# Patient Record
Sex: Female | Born: 2000 | Race: Black or African American | Hispanic: No | Marital: Single | Smoking: Never smoker
Health system: Southern US, Community
[De-identification: ages and names within clinical notes are randomized; demographics above are authoritative.]

---
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
## 2022-08-20 ENCOUNTER — Ambulatory Visit (HOSPITAL_COMMUNITY)
Admission: EM | Admit: 2022-08-20 | Discharge: 2022-08-20 | Disposition: A | Discharge status: Home / Self Care | Payer: Self-pay | Attending: Internal Medicine | Admitting: Internal Medicine

## 2022-08-20 DIAGNOSIS — R1030 Lower abdominal pain, unspecified: Secondary | ICD-10-CM | POA: Insufficient documentation

## 2022-08-20 DIAGNOSIS — R519 Headache, unspecified: Secondary | ICD-10-CM | POA: Insufficient documentation

## 2022-08-20 DIAGNOSIS — N3001 Acute cystitis with hematuria: Secondary | ICD-10-CM | POA: Insufficient documentation

## 2022-08-20 LAB — POCT URINALYSIS DIPSTICK, ED / UC
Bilirubin Urine: NEGATIVE
Glucose, UA: NEGATIVE mg/dL
Ketones, ur: NEGATIVE mg/dL
Nitrite: NEGATIVE
Protein, ur: 100 mg/dL — AB
Specific Gravity, Urine: 1.02 (ref 1.005–1.030)
Urobilinogen, UA: 1 mg/dL (ref 0.0–1.0)
pH: 5.5 (ref 5.0–8.0)

## 2022-08-20 LAB — POC URINE PREG, ED: Preg Test, Ur: NEGATIVE

## 2022-08-20 MED ORDER — CEPHALEXIN 500 MG PO CAPS: 500.0000 mg | ORAL_CAPSULE | Freq: Two times a day (BID) | ORAL | 0 refills | Status: AC

## 2022-08-20 MED ORDER — ACETAMINOPHEN 500 MG PO TABS: 1000.0000 mg | ORAL_TABLET | Freq: Four times a day (QID) | ORAL | 0 refills | Status: DC | PRN

## 2022-08-20 MED ORDER — IBUPROFEN 600 MG PO TABS: 600.0000 mg | ORAL_TABLET | Freq: Four times a day (QID) | ORAL | 0 refills | Status: AC | PRN

## 2022-08-20 NOTE — Discharge Instructions (Addendum)
You have a urinary tract infection.  Take Keflex twice daily for the next 7 days to treat this.  Take this medicine with food to avoid stomach upset.  Take ibuprofen every 6 hours as needed for abdominal cramping associated with menstrual cycle and headache.  Increase your water intake to at least 8 cups of water per day to stay well-hydrated.  We gave you a dose of ibuprofen in the clinic.  Your next dose may be at 2 AM tomorrow morning if needed.   Take Tylenol 1000 mg every 6 hours as needed for menstrual cramping and headache as well.   If you develop any new or worsening symptoms or do not improve in the next 2 to 3 days, please return.  If your symptoms are severe, please go to the emergency room.  Follow-up with your primary care provider for further evaluation and management of your symptoms as well as ongoing wellness visits.  I hope you feel better!

## 2022-08-20 NOTE — ED Provider Notes (Signed)
[facility]    CSN: [id number] Arrival date & time: 08/20/22  1549      History   Chief Complaint Chief Complaint  Patient presents with   Abdominal Cramping   Headache    HPI Charlene Cooper is a 21 y.o. female.   Patient presents presents to urgent care for evaluation of headache that started 3 days ago and abdominal cramping that started yesterday. Headache is to the back of the head and currently an 8.5 on a scale of 0-10.  Denies blurry vision and decreased visual acuity.  She has attempted use of 600 mg of Advil over-the-counter yesterday without relief of head pain.  She has had similar headaches in the past that have responded well to ibuprofen and Tylenol.  Denies upper respiratory tract infection symptoms, fever/chills, back pain, eye discharge, eye redness, ear pain, and sore throat.  She also reports abdominal cramping that is localized to the lower abdomen and currently rates this at a 9 on a scale of 0-10.  She states that this is consistent with abdominal cramping prior to her menstrual cycle.  Last menstrual cycle was at the end of July/the beginning of August and she states that she is scheduled to have a cycle soon.  Denies vaginal bleeding, discharge, odor, and itch.  Denies urinary frequency, urgency, and hesitancy but reports dysuria for the last week or so.  Reports nausea but denies vomiting.   Abdominal Cramping Associated symptoms include headaches.  Headache   No past medical history on file.  Patient Active Problem List   Diagnosis Date Noted   Pain in joint, pelvic region and thigh 10/24/2016      OB History   No obstetric history on file.      Home Medications    Prior to Admission medications   Medication Sig Start Date End Date Taking? Authorizing Provider  acetaminophen (TYLENOL) 500 MG tablet Take 2 tablets (1,000 mg total) by mouth every 6 (six) hours as needed. 08/20/22  Yes Carlisle Beers, FNP  cephALEXin (KEFLEX)  500 MG capsule Take 1 capsule (500 mg total) by mouth 2 (two) times daily for 7 days. 08/20/22 08/27/22 Yes Kavitha Lansdale, Donavan Burnet, FNP  ibuprofen (ADVIL) 600 MG tablet Take 1 tablet (600 mg total) by mouth every 6 (six) hours as needed. 08/20/22  Yes Carlisle Beers, FNP  pyridOXINE (VITAMIN B-6) 100 MG tablet Take 1 tablet (100 mg total) by mouth daily. 10/24/16   John Giovanni, MD    Family History No family history on file.  Social History     Allergies   Patient has no known allergies.   Review of Systems Review of Systems  Neurological:  Positive for headaches.  Per HPI   Physical Exam Triage Vital Signs ED Triage Vitals  Enc Vitals Group     BP 08/20/22 1635 111/63     Pulse Rate 08/20/22 1635 74     Resp 08/20/22 1635 17     Temp 08/20/22 1635 98.4 F (36.9 C)     Temp Source 08/20/22 1635 Oral     SpO2 08/20/22 1635 98 %     Weight --      Height --      Head Circumference --      Peak Flow --      Pain Score 08/20/22 1633 9     Pain Loc --      Pain Edu? --      Excl. in GC? --  No data found.  Updated Vital Signs BP 111/63 (BP Location: Left Arm)   Pulse 74   Temp 98.4 F (36.9 C) (Oral)   Resp 17   LMP 08/19/2022 (Exact Date)   SpO2 98%   Visual Acuity Right Eye Distance:   Left Eye Distance:   Bilateral Distance:    Right Eye Near:   Left Eye Near:    Bilateral Near:     Physical Exam Vitals and nursing note reviewed.  Constitutional:      Appearance: Normal appearance. She is not ill-appearing or toxic-appearing.     Comments: Very pleasant patient sitting on exam in position of comfort table in no acute distress.   HENT:     Head: Normocephalic and atraumatic.     Right Ear: Hearing and external ear normal.     Left Ear: Hearing and external ear normal.     Nose: Nose normal.     Mouth/Throat:     Lips: Pink.     Mouth: Mucous membranes are moist.  Eyes:     General: Lids are normal. Vision grossly intact. Gaze aligned  appropriately.     Extraocular Movements: Extraocular movements intact.     Conjunctiva/sclera: Conjunctivae normal.  Cardiovascular:     Rate and Rhythm: Normal rate and regular rhythm.     Heart sounds: Normal heart sounds, S1 normal and S2 normal.  Pulmonary:     Effort: Pulmonary effort is normal. No respiratory distress.     Breath sounds: Normal breath sounds and air entry.  Abdominal:     General: Bowel sounds are normal.     Palpations: Abdomen is soft.     Tenderness: There is abdominal tenderness in the suprapubic area. There is no right CVA tenderness, left CVA tenderness or guarding.     Comments: No peritoneal signs.  Musculoskeletal:     Cervical back: Neck supple.  Skin:    General: Skin is warm and dry.     Capillary Refill: Capillary refill takes less than 2 seconds.     Findings: No rash.  Neurological:     General: No focal deficit present.     Mental Status: She is alert and oriented to person, place, and time. Mental status is at baseline.     Cranial Nerves: No dysarthria or facial asymmetry.     Gait: Gait is intact.  Psychiatric:        Mood and Affect: Mood normal.        Speech: Speech normal.        Behavior: Behavior normal.        Thought Content: Thought content normal.        Judgment: Judgment normal.      UC Treatments / Results  Labs (all labs ordered are listed, but only abnormal results are displayed) Labs Reviewed  POCT URINALYSIS DIPSTICK, ED / UC - Abnormal; Notable for the following components:      Result Value   Hgb urine dipstick LARGE (*)    Protein, ur 100 (*)    Leukocytes,Ua TRACE (*)    All other components within normal limits  URINE CULTURE  POC URINE PREG, ED    EKG   Radiology No results found.  Procedures Procedures (including critical care time)  Medications Ordered in UC Medications - No data to display  Initial Impression / Assessment and Plan / UC Course  I have reviewed the triage vital signs and  the nursing notes.  Pertinent labs &  imaging results that were available during my care of the patient were reviewed by me and considered in my medical decision making (see chart for details).   1. Acute cystitis with hematuria Urinalysis shows large hemoglobin with trace leukocytes.  Given patient's symptoms, this is consistent with uncomplicated urinary tract infection and we will begin Keflex twice daily for the next 7 days to treat this.  Urine culture is pending.  Advised patient to take medicine with food to avoid stomach upset.  Advised patient to increase water intake to at least 8 cups of water per day to stay well-hydrated and avoid urinary irritants.  2.  Lower abdominal pain and bad headache Lower abdominal pain is likely related to acute cystitis and may also be related to patient's oncoming menstrual cycle.  She may use 600 mg of ibuprofen every 6 hours as needed with food to reduce inflammation causing abdominal pain/cramping.  She may also apply heat to the abdomen as needed for further pain relief.  Tylenol 1000 mg every 6 hours may be used as needed for menstrual cramping and headache as well.  Nonfocal neuro exam.  Advised her to return to urgent care if headache does not improve or resolve in the next 24 to 48 hours with uses of medications prescribed at today's visit.  Discussed physical exam and available lab work findings in clinic with patient.  Counseled patient regarding appropriate use of medications and potential side effects for all medications recommended or prescribed today. Discussed red flag signs and symptoms of worsening condition,when to call the PCP office, return to urgent care, and when to seek higher level of care in the emergency department. Patient verbalizes understanding and agreement with plan. All questions answered. Patient discharged in stable condition.  Final Clinical Impressions(s) / UC Diagnoses   Final diagnoses:  Acute cystitis with hematuria  Bad  headache  Lower abdominal pain     Discharge Instructions      You have a urinary tract infection.  Take Keflex twice daily for the next 7 days to treat this.  Take this medicine with food to avoid stomach upset.  Take ibuprofen every 6 hours as needed for abdominal cramping associated with menstrual cycle and headache.  Increase your water intake to at least 8 cups of water per day to stay well-hydrated.  We gave you a dose of ibuprofen in the clinic.  Your next dose may be at 2 AM tomorrow morning if needed.   Take Tylenol 1000 mg every 6 hours as needed for menstrual cramping and headache as well.   If you develop any new or worsening symptoms or do not improve in the next 2 to 3 days, please return.  If your symptoms are severe, please go to the emergency room.  Follow-up with your primary care provider for further evaluation and management of your symptoms as well as ongoing wellness visits.  I hope you feel better!     ED Prescriptions     Medication Sig Dispense Auth. Provider   ibuprofen (ADVIL) 600 MG tablet Take 1 tablet (600 mg total) by mouth every 6 (six) hours as needed. 30 tablet Reita May M, FNP   cephALEXin (KEFLEX) 500 MG capsule Take 1 capsule (500 mg total) by mouth 2 (two) times daily for 7 days. 14 capsule Reita May M, FNP   acetaminophen (TYLENOL) 500 MG tablet Take 2 tablets (1,000 mg total) by mouth every 6 (six) hours as needed. 30 tablet Carlisle Beers, FNP  PDMP not reviewed this encounter.   Reita May [place], [place] 08/23/22 [phone]

## 2022-08-20 NOTE — ED Triage Notes (Signed)
Pt reports lower abdominal cramping since yesterday and intermittent headache since Saturday. States has tried advil for pain with no relief.

## 2022-08-21 LAB — URINE CULTURE: Culture: <10000 — AB

## 2022-11-04 ENCOUNTER — Ambulatory Visit (HOSPITAL_COMMUNITY)
Admission: EM | Admit: 2022-11-04 | Discharge: 2022-11-04 | Disposition: A | Discharge status: Home / Self Care | Payer: Self-pay | Attending: Physician Assistant | Admitting: Physician Assistant

## 2022-11-04 ENCOUNTER — Encounter (HOSPITAL_COMMUNITY): Payer: Self-pay | Admitting: Emergency Medicine

## 2022-11-04 DIAGNOSIS — R051 Acute cough: Secondary | ICD-10-CM | POA: Insufficient documentation

## 2022-11-04 DIAGNOSIS — Z79899 Other long term (current) drug therapy: Secondary | ICD-10-CM | POA: Insufficient documentation

## 2022-11-04 DIAGNOSIS — Z1152 Encounter for screening for COVID-19: Secondary | ICD-10-CM | POA: Insufficient documentation

## 2022-11-04 DIAGNOSIS — J069 Acute upper respiratory infection, unspecified: Secondary | ICD-10-CM | POA: Insufficient documentation

## 2022-11-04 LAB — RESP PANEL BY RT-PCR (FLU A&B, COVID) ARPGX2
Influenza A by PCR: NEGATIVE
Influenza B by PCR: NEGATIVE
SARS Coronavirus 2 by RT PCR: NEGATIVE

## 2022-11-04 MED ORDER — PROMETHAZINE-DM 6.25-15 MG/5ML PO SYRP: 5.0000 mL | ORAL_SOLUTION | Freq: Three times a day (TID) | ORAL | 0 refills | Status: DC | PRN

## 2022-11-04 MED ORDER — FLUTICASONE PROPIONATE 50 MCG/ACT NA SUSP: 1.0000 | Freq: Every day | NASAL | 2 refills | Status: DC

## 2022-11-04 NOTE — ED Triage Notes (Signed)
Pt reports a sore throat, headache and chills x 4 days. Denies taking any medication.

## 2022-11-04 NOTE — ED Provider Notes (Signed)
[facility]    CSN: [id number] Arrival date & time: 11/04/22  1401      History   Chief Complaint Chief Complaint  Patient presents with   Sore Throat   Headache   Chills    HPI Charlene Cooper is a 21 y.o. female.   Patient presents today accompanied by her mother who provide the majority of history.  Reports that for the past 4 days she has had URI symptoms including sore throat, headache, chills, congestion, cough.  She denies any chest pain, shortness of breath, nausea, vomiting, diarrhea.  Denies any known sick contacts.  She is not taking any over-the-counter medication.  Denies any significant past medical history including allergies, asthma, COPD.  Denies any recent antibiotics or steroids.  She is confident that she is not pregnant.  She has not had a COVID-vaccine.  She has never had COVID in the past.    History reviewed. No pertinent past medical history.  Patient Active Problem List   Diagnosis Date Noted   Pain in joint, pelvic region and thigh 10/24/2016    History reviewed. No pertinent surgical history.  OB History   No obstetric history on file.      Home Medications    Prior to Admission medications   Medication Sig Start Date End Date Taking? Authorizing Provider  fluticasone (FLONASE) 50 MCG/ACT nasal spray Place 1 spray into both nostrils daily. 11/04/22  Yes Yee Joss K, PA-C  promethazine-dextromethorphan (PROMETHAZINE-DM) 6.25-15 MG/5ML syrup Take 5 mLs by mouth 3 (three) times daily as needed for cough. 11/04/22  Yes Aliayah Tyer, Noberto Retort, PA-C  acetaminophen (TYLENOL) 500 MG tablet Take 2 tablets (1,000 mg total) by mouth every 6 (six) hours as needed. 08/20/22   Carlisle Beers, FNP  ibuprofen (ADVIL) 600 MG tablet Take 1 tablet (600 mg total) by mouth every 6 (six) hours as needed. 08/20/22   Carlisle Beers, FNP  pyridOXINE (VITAMIN B-6) 100 MG tablet Take 1 tablet (100 mg total) by mouth daily. 10/24/16   John Giovanni, MD    Family History History reviewed. No pertinent family history.  Social History Social History   Tobacco Use   Smoking status: Never   Smokeless tobacco: Never     Allergies   Patient has no known allergies.   Review of Systems Review of Systems  Constitutional:  Positive for activity change. Negative for appetite change, fatigue and fever.  HENT:  Positive for congestion and sore throat. Negative for sinus pressure and sneezing.   Respiratory:  Positive for cough. Negative for shortness of breath.   Cardiovascular:  Negative for chest pain.  Gastrointestinal:  Negative for abdominal pain, diarrhea, nausea and vomiting.  Neurological:  Positive for headaches. Negative for dizziness and light-headedness.     Physical Exam Triage Vital Signs ED Triage Vitals  Enc Vitals Group     BP 11/04/22 1550 111/73     Pulse Rate 11/04/22 1550 94     Resp 11/04/22 1550 19     Temp 11/04/22 1550 100.1 F (37.8 C)     Temp Source 11/04/22 1550 Oral     SpO2 11/04/22 1550 97 %     Weight --      Height --      Head Circumference --      Peak Flow --      Pain Score 11/04/22 1551 5     Pain Loc --      Pain Edu? --  Excl. in GC? --    No data found.  Updated Vital Signs BP 111/73 (BP Location: Right Arm)   Pulse 94   Temp 100.1 F (37.8 C) (Oral)   Resp 19   SpO2 97%   Visual Acuity Right Eye Distance:   Left Eye Distance:   Bilateral Distance:    Right Eye Near:   Left Eye Near:    Bilateral Near:     Physical Exam Vitals reviewed.  Constitutional:      General: She is awake. She is not in acute distress.    Appearance: Normal appearance. She is well-developed. She is not ill-appearing.     Comments: Very pleasant female appears stated age in no acute distress sitting comfortably in exam room  HENT:     Head: Normocephalic and atraumatic.     Right Ear: Tympanic membrane, ear canal and external ear normal. Tympanic membrane is not  erythematous or bulging.     Left Ear: Tympanic membrane, ear canal and external ear normal. Tympanic membrane is not erythematous or bulging.     Nose:     Right Sinus: No maxillary sinus tenderness or frontal sinus tenderness.     Left Sinus: No maxillary sinus tenderness or frontal sinus tenderness.     Mouth/Throat:     Pharynx: Uvula midline. Posterior oropharyngeal erythema present. No oropharyngeal exudate.     Comments: Erythema and drainage in posterior oropharynx Cardiovascular:     Rate and Rhythm: Normal rate and regular rhythm.     Heart sounds: Normal heart sounds, S1 normal and S2 normal. No murmur heard. Pulmonary:     Effort: Pulmonary effort is normal.     Breath sounds: Normal breath sounds. No wheezing, rhonchi or rales.     Comments: Clear to auscultation bilaterally Psychiatric:        Behavior: Behavior is cooperative.      UC Treatments / Results  Labs (all labs ordered are listed, but only abnormal results are displayed) Labs Reviewed  RESP PANEL BY RT-PCR (FLU A&B, COVID) [id number]    EKG   Radiology No results found.  Procedures Procedures (including critical care time)  Medications Ordered in UC Medications - No data to display  Initial Impression / Assessment and Plan / UC Course  I have reviewed the triage vital signs and the nursing notes.  Pertinent labs & imaging results that were available during my care of the patient were reviewed by me and considered in my medical decision making (see chart for details).     Patient is well-appearing, nontachycardic.  Suspect viral etiology given congestion/cough.  No evidence of acute infection on physical exam that would warrant initiation of antibiotics.  Centor score of 0 so strep testing was deferred.  Flu/COVID were obtained and are pending.  We will contact her if she is positive and she was encouraged to monitor MyChart for these results; help her establish MyChart.  She is outside the window of  effectiveness for Tamiflu.  She is young and otherwise healthy so would not benefit from COVID antivirals.  She was given Promethazine DM for cough.  Discussed that she should use over-the-counter medications such as Flonase and decongestants for additional symptom relief.  Recommended she rest and drink plenty of fluid.  She was provided a work excuse note.  Discussed that if she has any worsening symptoms she needs to be seen immediately.  Final Clinical Impressions(s) / UC Diagnoses   Final diagnoses:  Upper respiratory tract  infection, unspecified type  Acute cough     Discharge Instructions      I believe you have a virus.  We will contact you if you are positive for COVID or flu.  Use Flonase for nasal congestion.  Use Promethazine DM for cough.  This will make you sleepy so do not drive or drink alcohol with taking it.  Make sure you rest and drink plenty of fluid.  If your symptoms are improving by next week return for reevaluation.  If you have any worsening symptoms including chest pain, shortness of breath, fever despite antipyretics, weakness, nausea/vomiting interfere with oral intake you need to be seen immediately.     ED Prescriptions     Medication Sig Dispense Auth. Provider   promethazine-dextromethorphan (PROMETHAZINE-DM) 6.25-15 MG/5ML syrup Take 5 mLs by mouth 3 (three) times daily as needed for cough. 118 mL Tasha Jindra K, PA-C   fluticasone (FLONASE) 50 MCG/ACT nasal spray Place 1 spray into both nostrils daily. 16 g Destynie Toomey K, PA-C      PDMP not reviewed this encounter.   Jeani Hawking, PA-C 11/04/22 1614

## 2022-11-04 NOTE — Discharge Instructions (Signed)
I believe you have a virus.  We will contact you if you are positive for COVID or flu.  Use Flonase for nasal congestion.  Use Promethazine DM for cough.  This will make you sleepy so do not drive or drink alcohol with taking it.  Make sure you rest and drink plenty of fluid.  If your symptoms are improving by next week return for reevaluation.  If you have any worsening symptoms including chest pain, shortness of breath, fever despite antipyretics, weakness, nausea/vomiting interfere with oral intake you need to be seen immediately.

## 2022-12-04 ENCOUNTER — Encounter (HOSPITAL_COMMUNITY): Payer: Self-pay

## 2022-12-04 ENCOUNTER — Ambulatory Visit (HOSPITAL_COMMUNITY)
Admission: EM | Admit: 2022-12-04 | Discharge: 2022-12-04 | Disposition: A | Discharge status: Home / Self Care | Payer: Self-pay | Attending: Family Medicine | Admitting: Family Medicine

## 2022-12-04 DIAGNOSIS — J029 Acute pharyngitis, unspecified: Secondary | ICD-10-CM

## 2022-12-04 LAB — POCT RAPID STREP A, ED / UC: Streptococcus, Group A Screen (Direct): NEGATIVE

## 2022-12-04 MED ORDER — ACETAMINOPHEN 325 MG PO TABS: 650.0000 mg | ORAL_TABLET | Freq: Once | ORAL | Status: DC

## 2022-12-04 NOTE — Discharge Instructions (Signed)
Your strep test was negative,culture pending. Rest,push fluids, may alternate tylenol/ibuprofen as label directed for pain/fever. May use chloraseptic throat lozenges. Follow up with PCP.

## 2022-12-04 NOTE — ED Triage Notes (Signed)
Pt reports sore throat x 2 days. Pt denies any other symptoms.

## 2022-12-04 NOTE — ED Provider Notes (Signed)
[facility]    CSN: [id number] Arrival date & time: 12/04/22  1724      History   Chief Complaint Chief Complaint  Patient presents with   Sore Throat    HPI Charlene Cooper is a 21 y.o. female.   21 year old female pt, Charlene Cooper, presents to ER with chief complaint of sore throat x 2 days. Pt has temp of 100.9 in urgent care. No treatment today, unknown illness exposure.  The history is provided by the patient. No language interpreter was used.    History reviewed. No pertinent past medical history.  Patient Active Problem List   Diagnosis Date Noted   Viral pharyngitis 12/04/2022   Pain in joint, pelvic region and thigh 10/24/2016    History reviewed. No pertinent surgical history.  OB History   No obstetric history on file.      Home Medications    Prior to Admission medications   Medication Sig Start Date End Date Taking? Authorizing Provider  acetaminophen (TYLENOL) 500 MG tablet Take 2 tablets (1,000 mg total) by mouth every 6 (six) hours as needed. 08/20/22   Carlisle Beers, FNP  fluticasone (FLONASE) 50 MCG/ACT nasal spray Place 1 spray into both nostrils daily. 11/04/22   Raspet, Noberto Retort, PA-C  ibuprofen (ADVIL) 600 MG tablet Take 1 tablet (600 mg total) by mouth every 6 (six) hours as needed. 08/20/22   Carlisle Beers, FNP  promethazine-dextromethorphan (PROMETHAZINE-DM) 6.25-15 MG/5ML syrup Take 5 mLs by mouth 3 (three) times daily as needed for cough. 11/04/22   Raspet, Noberto Retort, PA-C  pyridOXINE (VITAMIN B-6) 100 MG tablet Take 1 tablet (100 mg total) by mouth daily. 10/24/16   John Giovanni, MD    Family History History reviewed. No pertinent family history.  Social History Social History   Tobacco Use   Smoking status: Never   Smokeless tobacco: Never     Allergies   Patient has no known allergies.   Review of Systems Review of Systems  Constitutional:  Positive for fever.  HENT:  Positive for sore  throat.   Respiratory:  Negative for cough.   Cardiovascular:  Negative for chest pain.  Gastrointestinal:  Negative for abdominal pain.  All other systems reviewed and are negative.    Physical Exam Triage Vital Signs ED Triage Vitals  Enc Vitals Group     BP 12/04/22 1918 113/61     Pulse Rate 12/04/22 1918 95     Resp 12/04/22 1918 16     Temp 12/04/22 1918 (!) 100.9 F (38.3 C)     Temp Source 12/04/22 1918 Oral     SpO2 12/04/22 1918 100 %     Weight --      Height --      Head Circumference --      Peak Flow --      Pain Score 12/04/22 1919 5     Pain Loc --      Pain Edu? --      Excl. in [place]? --    No data found.  Updated Vital Signs BP 113/61 (BP Location: Left Arm)   Pulse 95   Temp (!) 100.9 F (38.3 C) (Oral)   Resp 16   LMP 11/11/2022 (Approximate)   SpO2 100%   Visual Acuity Right Eye Distance:   Left Eye Distance:   Bilateral Distance:    Right Eye Near:   Left Eye Near:    Bilateral Near:  Physical Exam Vitals and nursing note reviewed.  Constitutional:      General: She is not in acute distress.    Appearance: She is well-developed.  HENT:     Head: Normocephalic.     Right Ear: Tympanic membrane is retracted.     Left Ear: Tympanic membrane is retracted.     Nose: No congestion.     Mouth/Throat:     Lips: Pink.     Mouth: Mucous membranes are moist.     Pharynx: Oropharynx is clear. Uvula midline. Posterior oropharyngeal erythema present. No oropharyngeal exudate or uvula swelling.     Tonsils: No tonsillar exudate or tonsillar abscesses.  Eyes:     General: Lids are normal.     Conjunctiva/sclera: Conjunctivae normal.     Pupils: Pupils are equal, round, and reactive to light.  Neck:     Trachea: No tracheal deviation.  Cardiovascular:     Rate and Rhythm: Normal rate and regular rhythm.     Pulses: Normal pulses.     Heart sounds: Normal heart sounds. No murmur heard. Pulmonary:     Effort: Pulmonary effort is normal.      Breath sounds: Normal breath sounds.  Abdominal:     General: Bowel sounds are normal.     Palpations: Abdomen is soft.     Tenderness: There is no abdominal tenderness.  Musculoskeletal:        General: Normal range of motion.     Cervical back: Normal range of motion.  Lymphadenopathy:     Cervical: No cervical adenopathy.  Skin:    General: Skin is warm and dry.     Findings: No rash.  Neurological:     General: No focal deficit present.     Mental Status: She is alert and oriented to person, place, and time.     GCS: GCS eye subscore is 4. GCS verbal subscore is 5. GCS motor subscore is 6.  Psychiatric:        Speech: Speech normal.        Behavior: Behavior normal. Behavior is cooperative.      UC Treatments / Results  Labs (all labs ordered are listed, but only abnormal results are displayed) Labs Reviewed  CULTURE, GROUP A STREP [facility])  POCT RAPID STREP A, ED / UC  POCT RAPID STREP A, ED / UC    EKG   Radiology No results found.  Procedures Procedures (including critical care time)  Medications Ordered in UC Medications  acetaminophen (TYLENOL) tablet 650 mg (has no administration in time range)    Initial Impression / Assessment and Plan / UC Course  I have reviewed the triage vital signs and the nursing notes.  Pertinent labs & imaging results that were available during my care of the patient were reviewed by me and considered in my medical decision making (see chart for details).     Ddx: Viral pharyngitis, allergies, URI Final Clinical Impressions(s) / UC Diagnoses   Final diagnoses:  Viral pharyngitis     Discharge Instructions      Your strep test was negative,culture pending. Rest,push fluids, may alternate tylenol/ibuprofen as label directed for pain/fever. May use chloraseptic throat lozenges. Follow up with PCP.      ED Prescriptions   None    PDMP not reviewed this encounter.   Clancy Gourd, NP 12/04/22 2006

## 2022-12-06 LAB — CULTURE, GROUP A STREP (THRC)

## 2022-12-07 LAB — CULTURE, GROUP A STREP (THRC)

## 2023-01-06 ENCOUNTER — Ambulatory Visit (INDEPENDENT_AMBULATORY_CARE_PROVIDER_SITE_OTHER): Payer: Self-pay

## 2023-01-06 ENCOUNTER — Ambulatory Visit (HOSPITAL_COMMUNITY)
Admission: EM | Admit: 2023-01-06 | Discharge: 2023-01-06 | Disposition: A | Discharge status: Home / Self Care | Payer: Self-pay | Attending: Internal Medicine | Admitting: Internal Medicine

## 2023-01-06 ENCOUNTER — Encounter (HOSPITAL_COMMUNITY): Payer: Self-pay

## 2023-01-06 DIAGNOSIS — K5909 Other constipation: Secondary | ICD-10-CM | POA: Insufficient documentation

## 2023-01-06 DIAGNOSIS — R300 Dysuria: Secondary | ICD-10-CM | POA: Insufficient documentation

## 2023-01-06 DIAGNOSIS — R1030 Lower abdominal pain, unspecified: Secondary | ICD-10-CM

## 2023-01-06 LAB — POCT URINALYSIS DIPSTICK, ED / UC
Bilirubin Urine: NEGATIVE
Glucose, UA: NEGATIVE mg/dL
Hgb urine dipstick: NEGATIVE
Ketones, ur: NEGATIVE mg/dL
Nitrite: NEGATIVE
Protein, ur: NEGATIVE mg/dL
Specific Gravity, Urine: 1.025 (ref 1.005–1.030)
Urobilinogen, UA: 0.2 mg/dL (ref 0.0–1.0)
pH: 6.5 (ref 5.0–8.0)

## 2023-01-06 MED ORDER — NITROFURANTOIN MONOHYD MACRO 100 MG PO CAPS: 100.0000 mg | ORAL_CAPSULE | Freq: Two times a day (BID) | ORAL | 0 refills | Status: AC

## 2023-01-06 NOTE — Discharge Instructions (Signed)
Your urine has a few white cells which could be early bladder infection Your abdominal xray shows a lot of stool on the R side and upper across your abdomen. For this to empty out get Magnesium Citrate bottle and drink all tonight to help you empty out. To prevent constipation, you may get a natural chewable magnesium called CALM. Take as directed in the bottle. Also increase your fiber and water intake.  I am sending your urine for a culture and if we need to make changes to your medication, we will call you.

## 2023-01-06 NOTE — ED Triage Notes (Signed)
Pt is here for abdominal pain x 2days

## 2023-01-06 NOTE — ED Provider Notes (Signed)
[facility]    CSN: [id number] Arrival date & time: 01/06/23  1354      History   Chief Complaint No chief complaint on file.   HPI Charlene Cooper is a 22 y.o. female presents with lower  abdominal pain x 2 days. Started with RLQ pain first and has moved to her L side. She also has dysuria and frequency. Denies abnormal vaginal discharge, fever or flank pain. She has never had sex. LMP 12/23. Has been constipated. Her last BM was 3 days ago. Last time she had a UTI was 8/'23    History reviewed. No pertinent past medical history.  Patient Active Problem List   Diagnosis Date Noted   Viral pharyngitis 12/04/2022   Pain in joint, pelvic region and thigh 10/24/2016    History reviewed. No pertinent surgical history.  OB History   No obstetric history on file.      Home Medications    Prior to Admission medications   Medication Sig Start Date End Date Taking? Authorizing Provider  nitrofurantoin, macrocrystal-monohydrate, (MACROBID) 100 MG capsule Take 1 capsule (100 mg total) by mouth 2 (two) times daily. 01/06/23  Yes Rodriguez-Southworth, Sunday Spillers, PA-C  ibuprofen (ADVIL) 600 MG tablet Take 1 tablet (600 mg total) by mouth every 6 (six) hours as needed. 08/20/22   Talbot Grumbling, FNP    Family History History reviewed. No pertinent family history.  Social History Social History   Tobacco Use   Smoking status: Never   Smokeless tobacco: Never     Allergies   Patient has no known allergies.   Review of Systems Review of Systems  Constitutional:  Negative for chills, diaphoresis and fever.  Gastrointestinal:  Positive for abdominal pain and constipation. Negative for diarrhea, nausea and vomiting.  Genitourinary:  Positive for dysuria, frequency, pelvic pain and urgency. Negative for vaginal discharge.     Physical Exam Triage Vital Signs ED Triage Vitals  Enc Vitals Group     BP 01/06/23 1655 111/76     Pulse Rate 01/06/23 1655 (!)  111     Resp 01/06/23 1655 12     Temp 01/06/23 1655 98.3 F (36.8 C)     Temp Source 01/06/23 1655 Oral     SpO2 01/06/23 1655 98 %     Weight --      Height --      Head Circumference --      Peak Flow --      Pain Score 01/06/23 1654 9     Pain Loc --      Pain Edu? --      Excl. in [place]? --    No data found.  Updated Vital Signs BP 111/76 (BP Location: Left Arm)   Pulse (!) 111   Temp 98.3 F (36.8 C) (Oral)   Resp 12   LMP  (LMP Unknown) Comment: pt states it was last month but, do not recall date  SpO2 98%   Visual Acuity Right Eye Distance:   Left Eye Distance:   Bilateral Distance:    Right Eye Near:   Left Eye Near:    Bilateral Near:     Physical Exam  Physical Exam Vitals and nursing note reviewed.  Constitutional:      General: She is not in acute distress.    Appearance: She is not toxic-appearing.  HENT:     Head: Normocephalic.     Right Ear: External ear normal.     Left  Ear: External ear normal.  Eyes:     General: No scleral icterus.    Conjunctiva/sclera: Conjunctivae normal.  Pulmonary:     Effort: Pulmonary effort is normal.  Abdominal:     General: Bowel sounds are normal.     Palpations: Abdomen is soft. There is no mass.     Tenderness: There is no guarding or rebound. Has mild tenderness on RLQ and across upper abdomen.     Comments: - CVA tenderness   Musculoskeletal:        General: Normal range of motion.     Cervical back: Neck supple.      Skin:    General: Skin is warm and dry.     Findings: No rash.  Neurological:     Mental Status: She is alert and oriented to person, place, and time.     Gait: Gait normal.  Psychiatric:        Mood and Affect: Mood normal.        Behavior: Behavior normal.        Thought Content: Thought content normal.        Judgment: Judgment normal.   UC Treatments / Results  Labs (all labs ordered are listed, but only abnormal results are displayed) Labs Reviewed  POCT URINALYSIS  DIPSTICK, ED / UC - Abnormal; Notable for the following components:      Result Value   Leukocytes,Ua TRACE (*)    All other components within normal limits  URINE CULTURE    EKG   Radiology DG Abd 2 Views  Result Date: 01/06/2023 CLINICAL DATA:  Lower abdominal pain EXAM: ABDOMEN - 2 VIEW COMPARISON:  None Available. FINDINGS: The bowel gas pattern is normal. There is no evidence of free air. No radio-opaque calculi or other significant radiographic abnormality is seen. Mild to moderate stool in the ascending and transverse colon IMPRESSION: Negative. Electronically Signed   By: Jasmine Pang M.D.   On: 01/06/2023 17:44    Procedures Procedures (including critical care time)  Medications Ordered in UC Medications - No data to display  Initial Impression / Assessment and Plan / UC Course  I have reviewed the triage vital signs and the nursing notes.  Pertinent labs & imaging results that were available during my care of the patient were reviewed by me and considered in my medical decision making (see chart for details).   Dysuria Chronic constipation  I placed her on Macrobid and Mag citrate as noted. Urine culture ordered.  See instructions Final Clinical Impressions(s) / UC Diagnoses   Final diagnoses:  Dysuria  Chronic constipation     Discharge Instructions      Your urine has a few white cells which could be early bladder infection Your abdominal xray shows a lot of stool on the R side and upper across your abdomen. For this to empty out get Magnesium Citrate bottle and drink all tonight to help you empty out. To prevent constipation, you may get a natural chewable magnesium called CALM. Take as directed in the bottle. Also increase your fiber and water intake.  I am sending your urine for a culture and if we need to make changes to your medication, we will call you.      ED Prescriptions     Medication Sig Dispense Auth. Provider   nitrofurantoin,  macrocrystal-monohydrate, (MACROBID) 100 MG capsule Take 1 capsule (100 mg total) by mouth 2 (two) times daily. 6 capsule Rodriguez-Southworth, Nettie Elm, [place]  PDMP not reviewed this encounter.   Shelby Mattocks, PA-C 01/06/23 1807

## 2023-01-07 LAB — URINE CULTURE: Culture: <10000 — AB
